# Patient Record
Sex: Female | Born: 2000
Health system: Southern US, Community
[De-identification: ages and names within clinical notes are randomized; demographics above are authoritative.]

## PROBLEM LIST (undated history)

## (undated) DIAGNOSIS — N760 Acute vaginitis: Secondary | ICD-10-CM

## (undated) DIAGNOSIS — Z789 Other specified health status: Secondary | ICD-10-CM

## (undated) DIAGNOSIS — B9689 Other specified bacterial agents as the cause of diseases classified elsewhere: Secondary | ICD-10-CM

## (undated) DIAGNOSIS — A749 Chlamydial infection, unspecified: Secondary | ICD-10-CM

## (undated) HISTORY — PX: NO PAST SURGERIES: SHX2092

---
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
## 2021-01-14 ENCOUNTER — Other Ambulatory Visit: Payer: Self-pay | Admitting: *Deleted

## 2021-01-14 ENCOUNTER — Telehealth: Payer: Self-pay | Admitting: *Deleted

## 2021-01-14 DIAGNOSIS — F53 Postpartum depression: Secondary | ICD-10-CM

## 2021-01-14 NOTE — Telephone Encounter (Signed)
TC from community health nurse completing postpartum follow up for this 20yo G4P3 who recently experienced PTD following LPNC. Nurse reports Edinburgh postpartum depression score = 13. Request referral to [facility]. Ambulatory referral to [facility] placed. Sue Lush notified for further follow up.

## 2021-01-17 ENCOUNTER — Encounter: Payer: Medicaid Other | Admitting: Licensed Clinical Social Worker

## 2021-01-23 ENCOUNTER — Ambulatory Visit: Payer: Medicaid Other

## 2021-04-03 ENCOUNTER — Ambulatory Visit: Payer: Medicaid Other | Admitting: Obstetrics

## 2021-07-10 ENCOUNTER — Encounter (HOSPITAL_COMMUNITY): Payer: Self-pay

## 2021-07-10 ENCOUNTER — Ambulatory Visit (HOSPITAL_COMMUNITY)
Admission: EM | Admit: 2021-07-10 | Discharge: 2021-07-10 | Disposition: A | Discharge status: Home / Self Care | Payer: Medicaid Other | Attending: Internal Medicine | Admitting: Internal Medicine

## 2021-07-10 DIAGNOSIS — Z202 Contact with and (suspected) exposure to infections with a predominantly sexual mode of transmission: Secondary | ICD-10-CM

## 2021-07-10 NOTE — ED Triage Notes (Signed)
Pt states her partner has herpes and she wants to be tested.  Pt has no symptoms at this time ?

## 2021-07-10 NOTE — Discharge Instructions (Addendum)
Herpes test is not indicated at this time since you do not have any rash ?Wait and see approach is the best approach at this time ?If you notice painful rash in the vaginal area, please return to urgent care to be reevaluated. ?

## 2021-07-10 NOTE — ED Provider Notes (Signed)
?[place] ? ? ? ?CSN: [id number] ?Arrival date & time: 07/10/21  1001 ? ? ?  ? ?History   ?Chief Complaint ?Chief Complaint  ?Patient presents with  ? Exposure to STD  ? ? ?HPI ?Pamela Rangel is a 21 y.o. female comes to the urgent care with concerns for genital herpes and is requesting testing for genital herpes.  Patient denies any history of genital herpes and believes that she has been exposed to somebody with genital herpes.  No rash, dysuria urgency or frequency.  No vaginal discharge..  ? ?HPI ? ?Past Medical History:  ?Diagnosis Date  ? Bacterial vaginosis   ? Chlamydia   ? Medical history non-contributory   ? ? ?Patient Active Problem List  ? Diagnosis Date Noted  ? Nexplanon in place 12/19/2020  ? Vaginal delivery 12/17/2020  ? Supervision of normal pregnancy, antepartum 11/14/2020  ? Limited prenatal care 02/07/2020  ? Preterm labor 02/07/2020  ? History of chlamydia infection 02/07/2020  ? History of depression 09/11/2018  ? History of anxiety 09/11/2018  ? History of suicide attempt 09/11/2018  ? Severe recurrent major depression without psychotic features ([place]) 06/11/2017  ? ? ?Past Surgical History:  ?Procedure Laterality Date  ? NO PAST SURGERIES    ? ? ?OB History   ? ? Gravida  ?4  ? Para  ?3  ? Term  ?1  ? Preterm  ?2  ? AB  ?1  ? Living  ?3  ?  ? ? SAB  ?1  ? IAB  ?   ? Ectopic  ?   ? Multiple  ?0  ? Live Births  ?3  ?   ?  ?  ? ? ? ?Home Medications   ? ?Prior to Admission medications   ?Medication Sig Start Date End Date Taking? Authorizing Provider  ?acetaminophen (TYLENOL) 500 MG tablet Take 2 tablets (1,000 mg total) by mouth every 8 (eight) hours as needed (pain). 12/19/20   Genia Del, MD  ?ibuprofen (ADVIL) 600 MG tablet Take 1 tablet (600 mg total) by mouth every 6 (six) hours as needed (pain). 12/19/20   Genia Del, MD  ? ? ?Family History ?History reviewed. No pertinent family history. ? ?Social History ?Social History  ? ?Tobacco Use  ? Smoking  status: Former  ? Smokeless tobacco: Never  ? Tobacco comments:  ?  last July 2019  ?Vaping Use  ? Vaping Use: Never used  ?Substance Use Topics  ? Alcohol use: Never  ? Drug use: Not Currently  ? ? ? ?Allergies   ?Patient has no known allergies. ? ? ?Review of Systems ?Review of Systems  ?All other systems reviewed and are negative. ? ? ?Physical Exam ?Triage Vital Signs ?ED Triage Vitals  ?Enc Vitals Group  ?   BP 07/10/21 1101 (!) 148/73  ?   Pulse Rate 07/10/21 1101 78  ?   Resp 07/10/21 1101 16  ?   Temp 07/10/21 1101 98.9 ?F (37.2 ?C)  ?   Temp Source 07/10/21 1101 Oral  ?   SpO2 07/10/21 1101 98 %  ?   Weight --   ?   Height --   ?   Head Circumference --   ?   Peak Flow --   ?   Pain Score 07/10/21 1102 0  ?   Pain Loc --   ?   Pain Edu? --   ?   Excl. in [place]? --   ? ?  No data found. ? ?Updated Vital Signs ?BP (!) 148/73 (BP Location: Right Arm)   Pulse 78   Temp 98.9 ?F (37.2 ?C) (Oral)   Resp 16   LMP 07/03/2021 (Approximate)   SpO2 98%  ? ?Visual Acuity ?Right Eye Distance:   ?Left Eye Distance:   ?Bilateral Distance:   ? ?Right Eye Near:   ?Left Eye Near:    ?Bilateral Near:    ? ?Physical Exam ?Vitals and nursing note reviewed.  ?Constitutional:   ?   Appearance: Normal appearance.  ?Skin: ?   General: Skin is warm.  ?Neurological:  ?   General: No focal deficit present.  ?   Mental Status: She is alert.  ? ? ? ?UC Treatments / Results  ?Labs ?(all labs ordered are listed, but only abnormal results are displayed) ?Labs Reviewed - No data to display ? ?EKG ? ? ?Radiology ?No results found. ? ?Procedures ?Procedures (including critical care time) ? ?Medications Ordered in UC ?Medications - No data to display ? ?Initial Impression / Assessment and Plan / UC Course  ?I have reviewed the triage vital signs and the nursing notes. ? ?Pertinent labs & imaging results that were available during my care of the patient were reviewed by me and considered in my medical decision making (see chart for details). ? ?   ? ?1.  Possible exposure to genital herpes ?No indication for testing at this time ?Reassurance given ?Return precautions given ?Safe sex practices advised. ?Final Clinical Impressions(s) / UC Diagnoses  ? ?Final diagnoses:  ?Exposure to genital herpes  ? ? ? ?Discharge Instructions   ? ?  ?Herpes test is not indicated at this time since you do not have any rash ?Wait and see approach is the best approach at this time ?If you notice painful rash in the vaginal area, please return to urgent care to be reevaluated. ? ? ?ED Prescriptions   ?None ?  ? ?PDMP not reviewed this encounter. ?  ?Chase Picket, MD ?07/10/21 1141 ? ?
